# Patient Record
Sex: Male | Born: 1975 | Race: White | Hispanic: No | Marital: Married | Smoking: Former smoker
Health system: Southern US, Community
[De-identification: ages and names within clinical notes are randomized; demographics above are authoritative.]

## PROBLEM LIST (undated history)

## (undated) DIAGNOSIS — B2790 Infectious mononucleosis, unspecified without complication: Secondary | ICD-10-CM

## (undated) DIAGNOSIS — J302 Other seasonal allergic rhinitis: Secondary | ICD-10-CM

## (undated) HISTORY — PX: CHOLECYSTECTOMY: SHX55

## (undated) HISTORY — PX: WISDOM TOOTH EXTRACTION: SHX21

---

## 2007-01-09 ENCOUNTER — Emergency Department (HOSPITAL_COMMUNITY): Admission: EM | Admit: 2007-01-09 | Discharge: 2007-01-09 | Payer: Self-pay | Admitting: Emergency Medicine

## 2008-02-19 IMAGING — CR DG SHOULDER 2+V*L*
2 series · 2 of 2 positions shown · non-contrast
Comparison: none

CLINICAL DATA: Pain, deformity.
LEFT SHOULDER - 2 VIEW:

[w shoulder ap internal left]
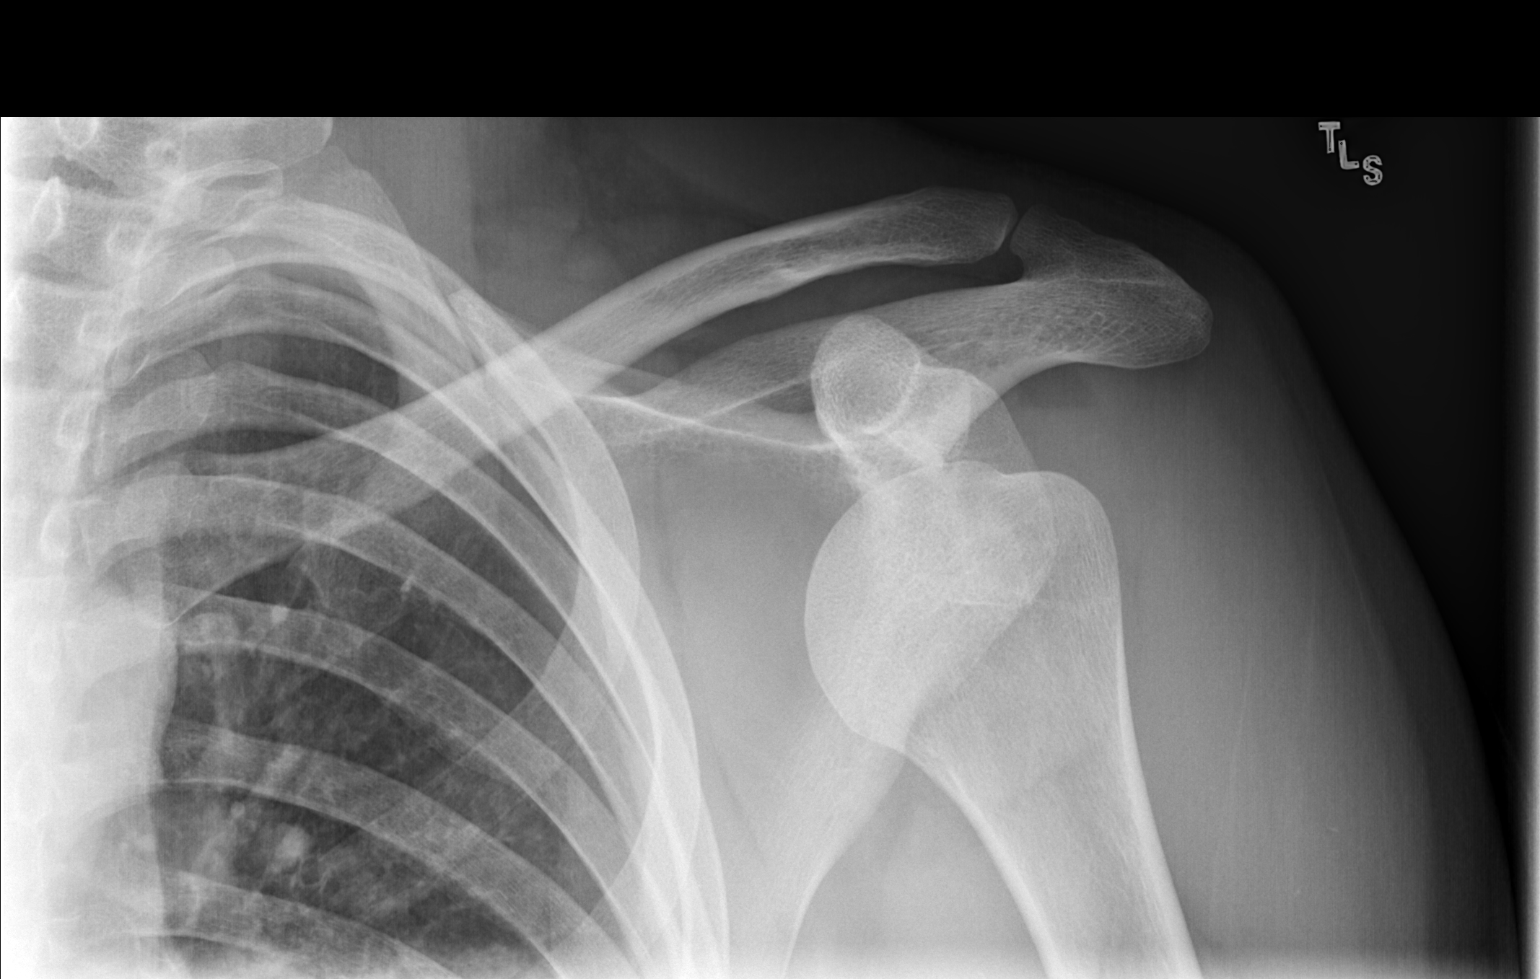

[w shoulder y view left]
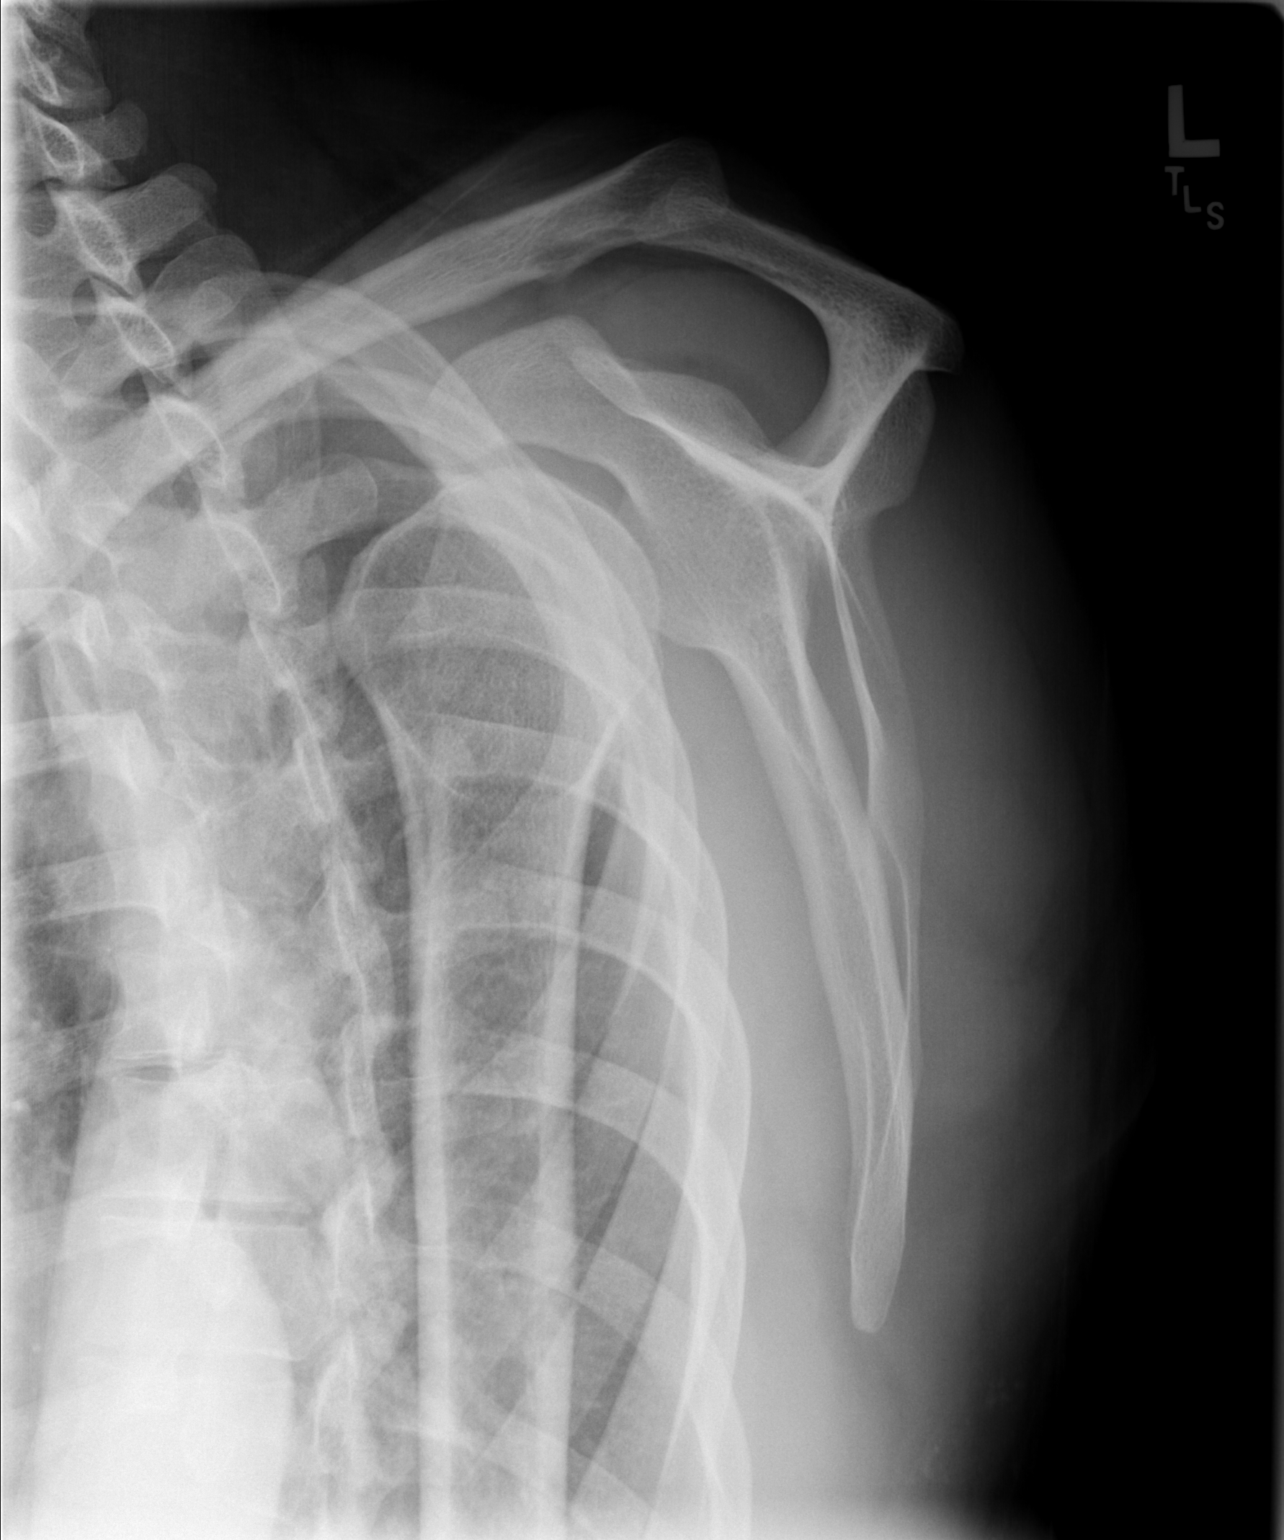

[2 of 2 positions shown; findings below may reference images not displayed]

FINDINGS: Acute anterior-inferior dislocation is noted of the humeral head in relation to the glenoid.  AC joint is normal.  No definite fracture.
IMPRESSION: Acute left shoulder anterior dislocation.

## 2012-04-30 ENCOUNTER — Emergency Department (HOSPITAL_BASED_OUTPATIENT_CLINIC_OR_DEPARTMENT_OTHER): Payer: BC Managed Care – PPO

## 2012-04-30 ENCOUNTER — Encounter (HOSPITAL_BASED_OUTPATIENT_CLINIC_OR_DEPARTMENT_OTHER): Payer: Self-pay | Admitting: *Deleted

## 2012-04-30 ENCOUNTER — Emergency Department (HOSPITAL_BASED_OUTPATIENT_CLINIC_OR_DEPARTMENT_OTHER)
Admission: EM | Admit: 2012-04-30 | Discharge: 2012-04-30 | Disposition: A | Discharge status: Home / Self Care | Payer: BC Managed Care – PPO | Attending: Emergency Medicine | Admitting: Emergency Medicine

## 2012-04-30 DIAGNOSIS — R0602 Shortness of breath: Secondary | ICD-10-CM | POA: Insufficient documentation

## 2012-04-30 DIAGNOSIS — R110 Nausea: Secondary | ICD-10-CM | POA: Insufficient documentation

## 2012-04-30 DIAGNOSIS — J40 Bronchitis, not specified as acute or chronic: Secondary | ICD-10-CM | POA: Insufficient documentation

## 2012-04-30 DIAGNOSIS — R062 Wheezing: Secondary | ICD-10-CM | POA: Insufficient documentation

## 2012-04-30 LAB — BASIC METABOLIC PANEL
CO2: 26 mEq/L (ref 19–32)
Calcium: 9.8 mg/dL (ref 8.4–10.5)
GFR calc Af Amer: >90 mL/min (ref >90)
GFR calc non Af Amer: >90 mL/min (ref >90)
Sodium: 137 mEq/L (ref 135–145)

## 2012-04-30 LAB — CBC WITH DIFFERENTIAL/PLATELET
Basophils Absolute: 0 10*3/uL (ref 0.0–0.1)
Eosinophils Absolute: 0 10*3/uL (ref 0.0–0.7)
Eosinophils Relative: 0 % (ref 0–5)
Lymphocytes Relative: 21 % (ref 12–46)
MCV: 85.8 fL (ref 78.0–100.0)
Platelets: 212 10*3/uL (ref 150–400)
RDW: 13 % (ref 11.5–15.5)
WBC: 15.1 10*3/uL — ABNORMAL HIGH (ref 4.0–10.5)

## 2012-04-30 MED ORDER — IPRATROPIUM BROMIDE 0.02 % IN SOLN
0.5000 mg | Freq: Once | RESPIRATORY_TRACT | Status: AC
  Administered 2012-04-30: 0.5 mg via RESPIRATORY_TRACT

## 2012-04-30 MED ORDER — ALBUTEROL SULFATE HFA 108 (90 BASE) MCG/ACT IN AERS
2.0000 | INHALATION_SPRAY | RESPIRATORY_TRACT | Status: DC
  Administered 2012-04-30: 2 via RESPIRATORY_TRACT

## 2012-04-30 MED ORDER — IPRATROPIUM BROMIDE 0.02 % IN SOLN
RESPIRATORY_TRACT | Status: AC
  Administered 2012-04-30: 0.5 mg via RESPIRATORY_TRACT
  Filled 2012-04-30: qty 2.5

## 2012-04-30 MED ORDER — ALBUTEROL SULFATE (5 MG/ML) 0.5% IN NEBU
INHALATION_SOLUTION | RESPIRATORY_TRACT | Status: AC
  Administered 2012-04-30: 5 mg via RESPIRATORY_TRACT
  Filled 2012-04-30: qty 1

## 2012-04-30 MED ORDER — ALBUTEROL SULFATE (5 MG/ML) 0.5% IN NEBU
5.0000 mg | INHALATION_SOLUTION | Freq: Once | RESPIRATORY_TRACT | Status: AC
  Administered 2012-04-30: 5 mg via RESPIRATORY_TRACT

## 2012-04-30 MED ORDER — METHYLPREDNISOLONE SODIUM SUCC 125 MG IJ SOLR
125.0000 mg | Freq: Once | INTRAMUSCULAR | Status: AC
  Administered 2012-04-30: 125 mg via INTRAVENOUS
  Filled 2012-04-30: qty 2

## 2012-04-30 MED ORDER — ALBUTEROL SULFATE HFA 108 (90 BASE) MCG/ACT IN AERS
INHALATION_SPRAY | RESPIRATORY_TRACT | Status: AC
  Administered 2012-04-30: 2 via RESPIRATORY_TRACT
  Filled 2012-04-30: qty 6.7

## 2012-04-30 MED ORDER — SODIUM CHLORIDE 0.9 % IV SOLN
Freq: Once | INTRAVENOUS | Status: AC
  Administered 2012-04-30: 21:00:00 via INTRAVENOUS

## 2012-04-30 NOTE — ED Provider Notes (Signed)
History     CSN: [id number]  Arrival date & time 04/30/12  1847   First MD Initiated Contact with Patient 04/30/12 1856      Chief Complaint  Patient presents with  . Shortness of Breath    (Consider location/radiation/quality/duration/timing/severity/associated sxs/prior treatment) Patient is a 36 y.o. male presenting with shortness of breath. The history is provided by the patient.  Shortness of Breath  The current episode started 5 to 7 days ago. The onset was gradual. The problem has been gradually worsening. The problem is severe. Nothing relieves the symptoms. Associated symptoms include shortness of breath. He is currently using steroids.  Pt reports he was working in an old house a month ago and was exposed to mold.  Pt seen at urgent care on Wednesday and started on prednisone and zithromax.  Pt complains of contniued shortness of breath.   History reviewed. No pertinent past medical history.  Past Surgical History  Procedure Date  . Cholecystectomy     History reviewed. No pertinent family history.  History  Substance Use Topics  . Smoking status: Never Smoker   . Smokeless tobacco: Not on file  . Alcohol Use: No      Review of Systems  Respiratory: Positive for shortness of breath.   All other systems reviewed and are negative.    Allergies  Review of patient's allergies indicates no known allergies.  Home Medications  No current outpatient prescriptions on file.  BP 154/99  Pulse 68  Temp 98.4 F (36.9 C) (Oral)  Resp 20  Ht 6\' 2"  (1.88 m)  Wt 210 lb (95.255 kg)  BMI 26.96 kg/m2  SpO2 99%  Physical Exam  Nursing note and vitals reviewed. Constitutional: He is oriented to person, place, and time. He appears well-developed and well-nourished.  HENT:  Head: Normocephalic and atraumatic.  Right Ear: External ear normal.  Left Ear: External ear normal.  Nose: Nose normal.  Mouth/Throat: Oropharynx is clear and moist.  Eyes: Conjunctivae and  EOM are normal. Pupils are equal, round, and reactive to light.  Neck: Normal range of motion. Neck supple.  Cardiovascular: Normal rate, regular rhythm and normal heart sounds.   Pulmonary/Chest: He has wheezes.  Abdominal: Soft.  Musculoskeletal: Normal range of motion.  Neurological: He is alert and oriented to person, place, and time.  Skin: Skin is warm.  Psychiatric: He has a normal mood and affect.    ED Course  Procedures (including critical care time)  Labs Reviewed - No data to display Dg Chest 2 View  04/30/2012  *RADIOLOGY REPORT*  Clinical Data: Shortness of breath, nausea.  CHEST - 2 VIEW  Comparison: None.  Findings: Vascular clips in the right upper abdomen. Lungs clear. Heart size and pulmonary vascularity normal.  No effusion. Visualized bones unremarkable.  IMPRESSION: No acute disease  Original Report Authenticated By: Osa Craver, M.D.     No diagnosis found.  Pt given albuterol by respiratory therapist,  On reexam,  Lungs are clear,  O2 99%  Vitals normal,  Chest xray normal.  Wife reports pt had multiple tick bites earlier this summer.   Pt symptoms seem mostly respiratory.   I advised continue z pack.  I will give albuterol.     MDM          Elson Areas, PA 04/30/12 2236

## 2012-04-30 NOTE — ED Notes (Signed)
Pt c/o SOB x 6 days , seen by PMD x 2 for same. No relief from meds

## 2012-04-30 NOTE — Progress Notes (Signed)
Patient was instructed on how to use an MDI with a spacer.  Patent was able to demonstrate correct use of MDI with spacer.

## 2012-04-30 NOTE — ED Provider Notes (Signed)
Medical screening examination/treatment/procedure(s) were performed by non-physician practitioner and as supervising physician I was immediately available for consultation/collaboration.   Charles B. Bernette Mayers, MD 04/30/12 2239

## 2012-05-01 LAB — ROCKY MTN SPOTTED FVR AB, IGG-BLOOD: RMSF IgG: 0.26 IV

## 2012-05-04 ENCOUNTER — Telehealth (HOSPITAL_BASED_OUTPATIENT_CLINIC_OR_DEPARTMENT_OTHER): Payer: Self-pay | Admitting: *Deleted

## 2012-05-04 NOTE — ED Notes (Signed)
Patient called requesting results of RMSF test.  Chart reviewed by Dr. Judd Lien.

## 2012-05-14 ENCOUNTER — Ambulatory Visit: Payer: BC Managed Care – PPO | Admitting: Family

## 2012-08-03 ENCOUNTER — Encounter: Payer: Self-pay | Admitting: Emergency Medicine

## 2012-08-03 ENCOUNTER — Emergency Department (INDEPENDENT_AMBULATORY_CARE_PROVIDER_SITE_OTHER)
Admission: EM | Admit: 2012-08-03 | Discharge: 2012-08-03 | Disposition: A | Discharge status: Home / Self Care | Payer: BC Managed Care – PPO | Source: Home / Self Care

## 2012-08-03 DIAGNOSIS — B2790 Infectious mononucleosis, unspecified without complication: Secondary | ICD-10-CM

## 2012-08-03 HISTORY — DX: Infectious mononucleosis, unspecified without complication: B27.90

## 2012-08-03 NOTE — ED Provider Notes (Signed)
History     CSN: [id number]  Arrival date & time 08/03/12  1739   First MD Initiated Contact with Patient 08/03/12 1812      Chief Complaint  Patient presents with  . Generalized Body Aches  . Fatigue   HPI Pt presents today with history of protracted course of mono.  Pt states that he initially developed symptoms of generalized malaise, fatigue, and cough back in July/August of earlier this year. Pt states that he was seen by many doctors and went through many treatments including rounds of prednisone until he was finally diagnosed with mono approx 1 month ago. Pt states that he has had persistent bouts of diffuse sxs including body aches, malaise, fatigue, dizziness, headaches since July. Pt states that sxs were initially improving but have seemed to flare over the last 2-3 weeks. Pt reports that he has also had episodes of mild hand and facial tingling as well as brief episodes of mild dizziness.  Pt denies any CP, fevers, vision change, syncopal events, hemiparesis, or confusion.  Pt is aware that there are long term complications to mono, but would like to see specialist about the issue.  Pt attempted to make appt for ID, but was told that he needed a formal referral.     Past Medical History  Diagnosis Date  . Mononucleosis     Past Surgical History  Procedure Date  . Cholecystectomy   . Wisdom tooth extraction     Family History  Problem Relation Age of Onset  . Asthma Mother   . Diabetes Father     History  Substance Use Topics  . Smoking status: Never Smoker   . Smokeless tobacco: Not on file  . Alcohol Use: Yes      Review of Systems  All other systems reviewed and are negative.    Allergies  Prednisone  Home Medications   Current Outpatient Rx  Name  Route  Sig  Dispense  Refill  . ASPIRIN 325 MG PO TABS   Oral   Take 650 mg by mouth daily. For pain.         Marland Kitchen AZITHROMYCIN 250 MG PO TABS   Oral   Take 250 mg by mouth daily.         Marland Kitchen  DEXTROMETHORPHAN POLISTIREX ER 30 MG/5ML PO LQCR   Oral   Take 60 mg by mouth as needed. For cough.         Marland Kitchen HYDROCODONE-HOMATROPINE 5-1.5 MG/5ML PO SYRP   Oral   Take 5 mLs by mouth every 6 (six) hours as needed. For cough.         . IBUPROFEN 800 MG PO TABS   Oral   Take 800 mg by mouth every 8 (eight) hours as needed. For pain.         Marland Kitchen LYSINE 1000 MG PO TABS   Oral   Take 1 tablet by mouth daily.         Marland Kitchen ONE-DAILY MULTI VITAMINS PO TABS   Oral   Take 1 tablet by mouth daily.         Marland Kitchen PREDNISONE 20 MG PO TABS   Oral   Take 20 mg by mouth daily.           BP 117/72  Pulse 72  Temp 97.5 F (36.4 C) (Oral)  Resp 16  Ht 6\' 2"  (1.88 m)  Wt 199 lb (90.266 kg)  BMI 25.55 kg/m2  SpO2 98%  Physical Exam  Constitutional: He is oriented to person, place, and time. He appears well-developed and well-nourished.  HENT:  Head: Normocephalic and atraumatic.  Right Ear: External ear normal.  Left Ear: External ear normal.  Eyes: Pupils are equal, round, and reactive to light.  Neck: Normal range of motion. Neck supple.  Cardiovascular: Normal rate, regular rhythm and normal heart sounds.   Pulmonary/Chest: Effort normal and breath sounds normal.  Abdominal: Soft. Bowel sounds are normal.  Musculoskeletal: Normal range of motion.  Lymphadenopathy:    He has no cervical adenopathy.  Neurological: He is alert and oriented to person, place, and time.  Skin: Skin is warm.    ED Course  Procedures (including critical care time)   Labs Reviewed  CBC WITH DIFFERENTIAL  COMPREHENSIVE METABOLIC PANEL  EPSTEIN-BARR VIRUS VCA, IGG  EPSTEIN-BARR VIRUS VCA, IGM   No results found.   1. Mononucleosis syndrome       MDM  Given prolonged duration of sxs (>5 months), will formally refer to ID for further management.  WIll check baseline labs for any metabolic/hematologic derangement as well as EBV serologies.  Discussed general care. No splenomegaly on exam  , but discussed direct impact avoidance.       The patient and/or caregiver has been counseled thoroughly with regard to treatment plan and/or medications prescribed including dosage, schedule, interactions, rationale for use, and possible side effects and they verbalize understanding. Diagnoses and expected course of recovery discussed and will return if not improved as expected or if the condition worsens. Patient and/or caregiver verbalized understanding.           Doree Albee, MD 08/06/12 [phone]

## 2012-08-03 NOTE — ED Notes (Signed)
Patient reports onset of symptoms in July/August of 2013; finally diagnosed with Mono mid October; still not feeling well with ongoing generalized body aches, fatigue, dizziness, stomach pains. Spoke with our physician by phone and desires referral to infectious medicine provider.

## 2012-08-04 LAB — CBC WITH DIFFERENTIAL/PLATELET
Basophils Absolute: 0 10*3/uL (ref 0.0–0.1)
Lymphocytes Relative: 37 % (ref 12–46)
Neutro Abs: 4.9 10*3/uL (ref 1.7–7.7)
Neutrophils Relative %: 54 % (ref 43–77)
Platelets: 199 10*3/uL (ref 150–400)
RDW: 13.3 % (ref 11.5–15.5)
WBC: 9 10*3/uL (ref 4.0–10.5)

## 2012-08-04 LAB — COMPREHENSIVE METABOLIC PANEL
ALT: 39 U/L (ref 0–53)
AST: 21 U/L (ref 0–37)
Albumin: 4.9 g/dL (ref 3.5–5.2)
Alkaline Phosphatase: 59 U/L (ref 39–117)
Calcium: 9.7 mg/dL (ref 8.4–10.5)
Chloride: 103 mEq/L (ref 96–112)
Potassium: 4 mEq/L (ref 3.5–5.3)

## 2012-08-08 ENCOUNTER — Telehealth: Payer: Self-pay | Admitting: *Deleted

## 2012-08-09 ENCOUNTER — Telehealth: Payer: Self-pay | Admitting: Emergency Medicine

## 2013-02-13 ENCOUNTER — Ambulatory Visit (INDEPENDENT_AMBULATORY_CARE_PROVIDER_SITE_OTHER): Payer: BC Managed Care – PPO | Admitting: Internal Medicine

## 2013-02-13 ENCOUNTER — Encounter: Payer: Self-pay | Admitting: Internal Medicine

## 2013-02-13 VITALS — BP 120/81 | HR 88 | Temp 98.1°F | Ht 74.0 in | Wt 181.0 lb

## 2013-02-13 DIAGNOSIS — R5383 Other fatigue: Secondary | ICD-10-CM

## 2013-02-13 DIAGNOSIS — R5382 Chronic fatigue, unspecified: Secondary | ICD-10-CM

## 2013-02-13 NOTE — Progress Notes (Signed)
[facility] NOTE  RFV: lyme disease workup Subjective:    Patient ID: Vincent Oliver, male    DOB: 1976/09/07, 37 y.o.   MRN: [id number]  HPI 37 yo Male who no significant past medical history who started  feeling poorly, in July 2013, malaise, woke up with chest pressure.he was seen in ED and thought it may be acute bronchitis with asthamtic features, he was given prednisone for 5 days. Poor appetite. Gave azithromycin and flonase for 5 days.   He does recall going hunting in the spring of 2013 where he had removed numerous ticks from his skin. He did have one bite that had erythema surround the site but no further rashs or EM   Since his symptoms did not improve, he was Then given levofloxacin for 10 days for pneumonia. He reports being depleted of energy, often bed bound Went to ED in August.  throughout the time was not feeling poorly, chills, felt feverish. In January placed on doxycycline for 6 wks. Also had panic attacks. He states that his symptoms improved after 2 wks of therapy. Feeling better in March, started back with exercise with good. In March he had recurrence of lymph node at groin, then followed chills, fatigue for 5 days. All of which has resolved.  During this time he has had numerous lab testing including lyme wb (IgM 2 bands +, IgG only 1 band), RMSF negative. erhlichiosis, Babesia negative, EBV in the fall shows signs of past infection. He also states that he was checked for autoimmune disease which were negative.  He is here to figure out if he has chronic lyme or other disease process that could account for this presentation  Allergies  Allergen Reactions  . Prednisone     Experienced increased anxiety, jittery, loss of appetite; uncertain if from this medication or part of disease process.   Current Outpatient Prescriptions on File Prior to Visit  Medication Sig Dispense Refill  . Lysine 1000 MG TABS Take 1 tablet by mouth daily.      . Multiple Vitamin (MULTIVITAMIN)  tablet Take 1 tablet by mouth daily.      Marland Kitchen aspirin 325 MG tablet Take 650 mg by mouth daily. For pain.      Marland Kitchen azithromycin (ZITHROMAX) 250 MG tablet Take 250 mg by mouth daily.      Marland Kitchen dextromethorphan (DELSYM) 30 MG/5ML liquid Take 60 mg by mouth as needed. For cough.      Marland Kitchen HYDROcodone-homatropine (HYCODAN) 5-1.5 MG/5ML syrup Take 5 mLs by mouth every 6 (six) hours as needed. For cough.      Marland Kitchen ibuprofen (ADVIL,MOTRIN) 800 MG tablet Take 800 mg by mouth every 8 (eight) hours as needed. For pain.      . predniSONE (DELTASONE) 20 MG tablet Take 20 mg by mouth daily.       No current facility-administered medications on file prior to visit.      Pmhx: Hx of dislocated shoulder cholecystectomy    Social hx: married, wife healthy. Married in Oct. Quit smoking 6 yr ago. No alcohol in 7 mo. Rare beer. Only missed a few days a work still working full-time, [employer] work. Enjoys hunting. Went in the spring 2013, pulled ticks out numerous ticks  Family hx: rheumatoid arthritis (GM)  Review of Systems  He reports haivng + Conjunctivitis, joint pain, dry mouth?, HA, myalgia, paresthesia.  Constitutional: Negative for fever, chills, diaphoresis, activity change, appetite change, fatigue and unexpected weight change.  HENT: Negative for congestion,  sore throat, rhinorrhea, sneezing, trouble swallowing and sinus pressure.  Eyes: Negative for photophobia and visual disturbance.  Respiratory: Negative for cough, chest tightness, shortness of breath, wheezing and stridor.  Cardiovascular: Negative for chest pain, palpitations and leg swelling.  Gastrointestinal: Negative for nausea, vomiting, abdominal pain, diarrhea, constipation, blood in stool, abdominal distention and anal bleeding.  Genitourinary: Negative for dysuria, hematuria, flank pain and difficulty urinating.  Musculoskeletal: Negative for myalgias, back pain, joint swelling, arthralgias and gait problem.  Skin: Negative  for color change, pallor, rash and wound.  Neurological: Negative for dizziness, tremors, weakness and light-headedness.  Hematological: Negative for adenopathy. Does not bruise/bleed easily.  Psychiatric/Behavioral: Negative for behavioral problems, confusion, sleep disturbance, dysphoric mood, decreased concentration and agitation.       Objective:   Physical Exam BP 120/81  Pulse 88  Temp(Src) 98.1 F (36.7 C) (Oral)  Ht 6\' 2"  (1.88 m)  Wt 181 lb (82.101 kg)  BMI 23.23 kg/m2 Physical Exam  Constitutional: He is oriented to person, place, and time. He appears well-developed and well-nourished. No distress.  HENT:  Mouth/Throat: Oropharynx is clear and moist. No oropharyngeal exudate.  Cardiovascular: Normal rate, regular rhythm and normal heart sounds. Exam reveals no gallop and no friction rub.  No murmur heard.  Pulmonary/Chest: Effort normal and breath sounds normal. No respiratory distress. He has no wheezes.  Abdominal: Soft. Bowel sounds are normal. He exhibits no distension. There is no tenderness.  Lymphadenopathy:  He has no cervical adenopathy.  Neurological: He is alert and oriented to person, place, and time.  Skin: Skin is warm and dry. No rash noted. No erythema.  Psychiatric: He has a normal mood and affect. His behavior is normal.       Assessment & Plan:  Chronic fatigue like syndrome = lab tests do not suggest that this clinical picture was lyme disease. I would not recommend further treatment with doxycycline since has finished 6 wks course of therapy. Unfortunately, it is difficult to tell what is causing his symptoms, although it appears that he is improving. Since he has had a thorough tick-borne testing work up, i will not repeat these tests at this time. I mentioned htat some of these symptoms appear that they could be manifestation of anxiety and that he may consider doing a trial of SSRI for anxiety/depression.  RTC PRN

## 2014-05-07 ENCOUNTER — Emergency Department (INDEPENDENT_AMBULATORY_CARE_PROVIDER_SITE_OTHER)
Admission: EM | Admit: 2014-05-07 | Discharge: 2014-05-07 | Disposition: A | Discharge status: Home / Self Care | Payer: BC Managed Care – PPO | Source: Home / Self Care | Attending: Emergency Medicine | Admitting: Emergency Medicine

## 2014-05-07 ENCOUNTER — Encounter: Payer: Self-pay | Admitting: Emergency Medicine

## 2014-05-07 DIAGNOSIS — J0100 Acute maxillary sinusitis, unspecified: Secondary | ICD-10-CM

## 2014-05-07 DIAGNOSIS — J209 Acute bronchitis, unspecified: Secondary | ICD-10-CM

## 2014-05-07 DIAGNOSIS — J0101 Acute recurrent maxillary sinusitis: Secondary | ICD-10-CM

## 2014-05-07 DIAGNOSIS — J029 Acute pharyngitis, unspecified: Secondary | ICD-10-CM

## 2014-05-07 HISTORY — DX: Other seasonal allergic rhinitis: J30.2

## 2014-05-07 MED ORDER — PROMETHAZINE-CODEINE 6.25-10 MG/5ML PO SYRP: ORAL_SOLUTION | ORAL | Status: AC

## 2014-05-07 MED ORDER — AMOXICILLIN-POT CLAVULANATE 875-125 MG PO TABS: 1.0000 | ORAL_TABLET | Freq: Two times a day (BID) | ORAL | Status: AC

## 2014-05-07 NOTE — ED Provider Notes (Signed)
CSN: [id number]     Arrival date & time 05/07/14  1602 History   First MD Initiated Contact with Patient 05/07/14 1633     Chief Complaint  Patient presents with  . Cough  . Shortness of Breath  . Sore Throat   (Consider location/radiation/quality/duration/timing/severity/associated sxs/prior Treatment) HPI URI HISTORY  Vincent Oliver is a 38 y.o. male who complains of onset of cold symptoms for 5 days.  Have been using over-the-counter treatment which helps a little bit.  No chills/sweats +  Fever  +  Nasal congestion +  Thick, Discolored Post-nasal drainage Mild sinus pain/pressure Mild to moderate sore throat  +  Cough, occasionally productive of yellow sputum No wheezing Mild chest congestion No hemoptysis Denies shortness of breath No pleuritic pain  No itchy/red eyes No earache  No nausea No vomiting No abdominal pain No diarrhea  No skin rashes +  Fatigue No myalgias No headache   Past Medical History  Diagnosis Date  . Mononucleosis   . Seasonal allergies    Past Surgical History  Procedure Laterality Date  . Cholecystectomy    . Wisdom tooth extraction     Family History  Problem Relation Age of Onset  . Asthma Mother   . Osteoarthritis Mother   . Diabetes Father    History  Substance Use Topics  . Smoking status: Former Smoker    Quit date: 05/08/2007  . Smokeless tobacco: Not on file  . Alcohol Use: Yes    Review of Systems  All other systems reviewed and are negative.   Allergies  Prednisone  Home Medications   Prior to Admission medications   Medication Sig Start Date End Date Taking? Authorizing Provider  UNKNOWN TO PATIENT    Yes Historical Provider, MD  amoxicillin-clavulanate (AUGMENTIN) 875-125 MG per tablet Take 1 tablet by mouth every 12 (twelve) hours. Take for 10 days. Take with food. 05/07/14   Lajean Manesavid Massey, MD  aspirin 325 MG tablet Take 650 mg by mouth daily. For pain.    Historical Provider, MD  azithromycin (ZITHROMAX)  250 MG tablet Take 250 mg by mouth daily.    Historical Provider, MD  dextromethorphan (DELSYM) 30 MG/5ML liquid Take 60 mg by mouth as needed. For cough.    Historical Provider, MD  HYDROcodone-homatropine (HYCODAN) 5-1.5 MG/5ML syrup Take 5 mLs by mouth every 6 (six) hours as needed. For cough.    Historical Provider, MD  ibuprofen (ADVIL,MOTRIN) 800 MG tablet Take 800 mg by mouth every 8 (eight) hours as needed. For pain.    Historical Provider, MD  Lysine 1000 MG TABS Take 1 tablet by mouth daily.    Historical Provider, MD  Multiple Vitamin (MULTIVITAMIN) tablet Take 1 tablet by mouth daily.    Historical Provider, MD  predniSONE (DELTASONE) 20 MG tablet Take 20 mg by mouth daily.    Historical Provider, MD  promethazine-codeine (PHENERGAN WITH CODEINE) 6.25-10 MG/5ML syrup Take 1-2 teaspoons every 4-6 hours as needed for cough. May cause drowsiness. 05/07/14   Lajean Manesavid Massey, MD   BP 133/72  Pulse 82  Temp(Src) 98.2 F (36.8 C) (Oral)  Resp 18  Ht 6\' 2"  (1.88 m)  Wt 177 lb (80.287 kg)  BMI 22.72 kg/m2  SpO2 98% Physical Exam  Nursing note and vitals reviewed. Constitutional: He is oriented to person, place, and time. He appears well-developed and well-nourished. No distress.  HENT:  Head: Normocephalic and atraumatic.  Right Ear: Tympanic membrane, external ear and ear canal normal.  Left Ear:  Tympanic membrane, external ear and ear canal normal.  Nose: Mucosal edema and rhinorrhea present. Right sinus exhibits maxillary sinus tenderness. Left sinus exhibits maxillary sinus tenderness.  Mouth/Throat: No oral lesions. No oropharyngeal exudate.  Posterior pharynx red without tonsillar enlargement or exudate  Eyes: Right eye exhibits no discharge. Left eye exhibits no discharge. No scleral icterus.  Neck: Neck supple.  Cardiovascular: Normal rate, regular rhythm and normal heart sounds.   Pulmonary/Chest: Effort normal. No respiratory distress. He has no wheezes. He has rhonchi. He has  no rales.  Lymphadenopathy:    He has no cervical adenopathy.  Neurological: He is alert and oriented to person, place, and time.  Skin: Skin is warm and dry.    ED Course  Procedures (including critical care time) Labs Review Labs Reviewed - No data to display  Imaging Review No results found.   MDM   1. Acute recurrent maxillary sinusitis   2. Acute bronchitis, unspecified organism    Treatment options discussed, as well as risks, benefits, alternatives. Patient voiced understanding and agreement with the following plans: Augmentin Phenergan With Codeine, 1 or 2 teaspoons every 4-6 hours when necessary cough. Precautions discussed Other symptomatic care Follow-up with your primary care doctor in 5-7 days if not improving, or sooner if symptoms become worse. Precautions discussed. Red flags discussed. Questions invited and answered. Patient voiced understanding and agreement.     Lajean Manes, MD 05/07/14 [phone]

## 2014-05-07 NOTE — ED Notes (Signed)
Pt c/o sore throat, productive cough, runny nose, and SOB x 3 days. Denies fever.

## 2014-05-13 LAB — POCT RAPID STREP A (OFFICE): Rapid Strep A Screen: NEGATIVE
